# Patient Record
Sex: Male | Born: 1950
Health system: Southern US, Community
[De-identification: ages and names within clinical notes are randomized; demographics above are authoritative.]

## PROBLEM LIST (undated history)

## (undated) DIAGNOSIS — N200 Calculus of kidney: Secondary | ICD-10-CM

---

## 2001-05-11 ENCOUNTER — Encounter: Admission: RE | Admit: 2001-05-11 | Discharge: 2001-05-11 | Payer: Self-pay | Admitting: Family Medicine

## 2001-05-11 ENCOUNTER — Encounter: Payer: Self-pay | Admitting: Family Medicine

## 2006-08-08 ENCOUNTER — Emergency Department (HOSPITAL_COMMUNITY): Admission: EM | Admit: 2006-08-08 | Discharge: 2006-08-08 | Payer: Self-pay | Admitting: Emergency Medicine

## 2016-11-10 DIAGNOSIS — H9113 Presbycusis, bilateral: Secondary | ICD-10-CM | POA: Diagnosis not present

## 2016-11-10 DIAGNOSIS — J3489 Other specified disorders of nose and nasal sinuses: Secondary | ICD-10-CM | POA: Diagnosis not present

## 2016-11-10 DIAGNOSIS — H903 Sensorineural hearing loss, bilateral: Secondary | ICD-10-CM | POA: Diagnosis not present

## 2016-11-10 DIAGNOSIS — H9313 Tinnitus, bilateral: Secondary | ICD-10-CM | POA: Diagnosis not present

## 2016-12-05 DIAGNOSIS — J028 Acute pharyngitis due to other specified organisms: Secondary | ICD-10-CM | POA: Diagnosis not present

## 2017-02-19 DIAGNOSIS — R109 Unspecified abdominal pain: Secondary | ICD-10-CM | POA: Diagnosis not present

## 2017-02-19 DIAGNOSIS — M545 Low back pain: Secondary | ICD-10-CM | POA: Diagnosis not present

## 2017-03-01 DIAGNOSIS — M545 Low back pain: Secondary | ICD-10-CM | POA: Diagnosis not present

## 2017-03-08 DIAGNOSIS — M545 Low back pain: Secondary | ICD-10-CM | POA: Diagnosis not present

## 2017-03-22 DIAGNOSIS — R69 Illness, unspecified: Secondary | ICD-10-CM | POA: Diagnosis not present

## 2017-10-08 DIAGNOSIS — Z23 Encounter for immunization: Secondary | ICD-10-CM | POA: Diagnosis not present

## 2017-10-08 DIAGNOSIS — Z125 Encounter for screening for malignant neoplasm of prostate: Secondary | ICD-10-CM | POA: Diagnosis not present

## 2017-10-08 DIAGNOSIS — K429 Umbilical hernia without obstruction or gangrene: Secondary | ICD-10-CM | POA: Diagnosis not present

## 2017-10-08 DIAGNOSIS — Z1159 Encounter for screening for other viral diseases: Secondary | ICD-10-CM | POA: Diagnosis not present

## 2017-10-08 DIAGNOSIS — Z0000 Encounter for general adult medical examination without abnormal findings: Secondary | ICD-10-CM | POA: Diagnosis not present

## 2017-10-08 DIAGNOSIS — E7800 Pure hypercholesterolemia, unspecified: Secondary | ICD-10-CM | POA: Diagnosis not present

## 2017-10-11 DIAGNOSIS — R69 Illness, unspecified: Secondary | ICD-10-CM | POA: Diagnosis not present

## 2017-11-01 DIAGNOSIS — J069 Acute upper respiratory infection, unspecified: Secondary | ICD-10-CM | POA: Diagnosis not present

## 2018-03-14 DIAGNOSIS — M545 Low back pain: Secondary | ICD-10-CM | POA: Diagnosis not present

## 2018-03-28 DIAGNOSIS — S335XXD Sprain of ligaments of lumbar spine, subsequent encounter: Secondary | ICD-10-CM | POA: Diagnosis not present

## 2018-03-28 DIAGNOSIS — M545 Low back pain: Secondary | ICD-10-CM | POA: Diagnosis not present

## 2018-04-11 DIAGNOSIS — J020 Streptococcal pharyngitis: Secondary | ICD-10-CM | POA: Diagnosis not present

## 2018-04-11 DIAGNOSIS — J029 Acute pharyngitis, unspecified: Secondary | ICD-10-CM | POA: Diagnosis not present

## 2018-06-29 DIAGNOSIS — R69 Illness, unspecified: Secondary | ICD-10-CM | POA: Diagnosis not present

## 2018-07-26 DIAGNOSIS — R69 Illness, unspecified: Secondary | ICD-10-CM | POA: Diagnosis not present

## 2018-09-16 ENCOUNTER — Emergency Department (HOSPITAL_COMMUNITY)
Admission: EM | Admit: 2018-09-16 | Discharge: 2018-09-16 | Disposition: A | Discharge status: Home / Self Care | Payer: Medicare HMO | Attending: Emergency Medicine | Admitting: Emergency Medicine

## 2018-09-16 ENCOUNTER — Encounter (HOSPITAL_COMMUNITY): Payer: Self-pay | Admitting: *Deleted

## 2018-09-16 ENCOUNTER — Other Ambulatory Visit: Payer: Self-pay

## 2018-09-16 ENCOUNTER — Emergency Department (HOSPITAL_COMMUNITY): Payer: Medicare HMO

## 2018-09-16 DIAGNOSIS — N200 Calculus of kidney: Secondary | ICD-10-CM | POA: Insufficient documentation

## 2018-09-16 DIAGNOSIS — Z79899 Other long term (current) drug therapy: Secondary | ICD-10-CM | POA: Insufficient documentation

## 2018-09-16 DIAGNOSIS — R109 Unspecified abdominal pain: Secondary | ICD-10-CM | POA: Diagnosis not present

## 2018-09-16 DIAGNOSIS — N132 Hydronephrosis with renal and ureteral calculous obstruction: Secondary | ICD-10-CM | POA: Diagnosis not present

## 2018-09-16 DIAGNOSIS — R1012 Left upper quadrant pain: Secondary | ICD-10-CM | POA: Diagnosis present

## 2018-09-16 HISTORY — DX: Calculus of kidney: N20.0

## 2018-09-16 LAB — CBC WITH DIFFERENTIAL/PLATELET
Abs Immature Granulocytes: 0.04 10*3/uL (ref 0.00–0.07)
Basophils Absolute: 0 10*3/uL (ref 0.0–0.1)
Basophils Relative: 0 %
Eosinophils Absolute: 0.3 10*3/uL (ref 0.0–0.5)
Eosinophils Relative: 4 %
HCT: 46.2 % (ref 39.0–52.0)
Hemoglobin: 15 g/dL (ref 13.0–17.0)
Immature Granulocytes: 1 %
Lymphocytes Relative: 15 %
Lymphs Abs: 1.1 10*3/uL (ref 0.7–4.0)
MCH: 29 pg (ref 26.0–34.0)
MCHC: 32.5 g/dL (ref 30.0–36.0)
MCV: 89.2 fL (ref 80.0–100.0)
Monocytes Absolute: 0.8 10*3/uL (ref 0.1–1.0)
Monocytes Relative: 10 %
Neutro Abs: 5.1 10*3/uL (ref 1.7–7.7)
Neutrophils Relative %: 70 %
Platelets: 238 10*3/uL (ref 150–400)
RBC: 5.18 MIL/uL (ref 4.22–5.81)
RDW: 13 % (ref 11.5–15.5)
WBC: 7.3 10*3/uL (ref 4.0–10.5)
nRBC: 0 % (ref 0.0–0.2)

## 2018-09-16 LAB — COMPREHENSIVE METABOLIC PANEL
ALT: 23 U/L (ref 0–44)
AST: 24 U/L (ref 15–41)
Albumin: 3.9 g/dL (ref 3.5–5.0)
Alkaline Phosphatase: 60 U/L (ref 38–126)
Anion gap: 14 (ref 5–15)
BUN: 17 mg/dL (ref 8–23)
CO2: 22 mmol/L (ref 22–32)
Calcium: 8.7 mg/dL — ABNORMAL LOW (ref 8.9–10.3)
Chloride: 104 mmol/L (ref 98–111)
Creatinine, Ser: 1.14 mg/dL (ref 0.61–1.24)
GFR calc Af Amer: >60 mL/min (ref >60)
GFR calc non Af Amer: >60 mL/min (ref >60)
Glucose, Bld: 120 mg/dL — ABNORMAL HIGH (ref 70–99)
Potassium: 4 mmol/L (ref 3.5–5.1)
Sodium: 140 mmol/L (ref 135–145)
Total Bilirubin: 0.7 mg/dL (ref 0.3–1.2)
Total Protein: 7.3 g/dL (ref 6.5–8.1)

## 2018-09-16 LAB — URINALYSIS, ROUTINE W REFLEX MICROSCOPIC
Bilirubin Urine: NEGATIVE
Glucose, UA: NEGATIVE mg/dL
Ketones, ur: NEGATIVE mg/dL
Leukocytes, UA: NEGATIVE
Nitrite: NEGATIVE
Protein, ur: NEGATIVE mg/dL
Specific Gravity, Urine: >1.03 — ABNORMAL HIGH (ref 1.005–1.030)
pH: 5.5 (ref 5.0–8.0)

## 2018-09-16 MED ORDER — TAMSULOSIN HCL 0.4 MG PO CAPS: 0.4000 mg | ORAL_CAPSULE | Freq: Every day | ORAL | 0 refills | Status: AC

## 2018-09-16 MED ORDER — ONDANSETRON HCL 4 MG/2ML IJ SOLN
4.0000 mg | Freq: Once | INTRAMUSCULAR | Status: AC
  Administered 2018-09-16: 4 mg via INTRAVENOUS
  Filled 2018-09-16: qty 2

## 2018-09-16 MED ORDER — HYDROCODONE-ACETAMINOPHEN 5-325 MG PO TABS: 1.0000 | ORAL_TABLET | Freq: Four times a day (QID) | ORAL | 0 refills | Status: AC | PRN

## 2018-09-16 MED ORDER — SODIUM CHLORIDE 0.9 % IV BOLUS
250.0000 mL | Freq: Once | INTRAVENOUS | Status: AC
  Administered 2018-09-16: 250 mL via INTRAVENOUS

## 2018-09-16 MED ORDER — ONDANSETRON HCL 4 MG PO TABS: 4.0000 mg | ORAL_TABLET | Freq: Three times a day (TID) | ORAL | 0 refills | Status: AC | PRN

## 2018-09-16 MED ORDER — MORPHINE SULFATE (PF) 4 MG/ML IV SOLN
4.0000 mg | Freq: Once | INTRAVENOUS | Status: AC
  Administered 2018-09-16: 4 mg via INTRAVENOUS
  Filled 2018-09-16: qty 1

## 2018-09-16 MED ORDER — KETOROLAC TROMETHAMINE 15 MG/ML IJ SOLN
15.0000 mg | Freq: Once | INTRAMUSCULAR | Status: AC
  Administered 2018-09-16: 15 mg via INTRAVENOUS
  Filled 2018-09-16: qty 1

## 2018-09-16 MED ORDER — HYDROMORPHONE HCL 1 MG/ML IJ SOLN
0.5000 mg | Freq: Once | INTRAMUSCULAR | Status: AC
  Administered 2018-09-16: 0.5 mg via INTRAVENOUS
  Filled 2018-09-16: qty 1

## 2018-09-16 NOTE — ED Notes (Signed)
Discharge instructions reviewed with patient. Patient verbalizes understanding. VSS.   

## 2018-09-16 NOTE — ED Provider Notes (Signed)
[facility]-EMERGENCY DEPT Provider Note   CSN: [id number] Arrival date & time: 09/16/18  0216     History   Chief Complaint Chief Complaint  Patient presents with  . Flank Pain    HPI Maryln GottronGordon J Thiemann is a 67 y.o. male presenting for evaluation of left-sided flank pain.  Patient states he developed left-sided flank pain earlier tonight.  It is sharp and severe, but waxing and waning, coming in waves.  He denies associated fevers, chills, chest pain, shortness of breath, nausea, vomiting, anterior abdominal pain, urinary symptoms, normal bowel movements.  Patient states he had a kidney stone about 10 years ago, this feels similar.  He states he has no medical problems, takes simvastatin and no other medicines daily.  He ibuprofen prior to arrival, with minimal improvement of his symptoms.  He has not taken anything else.  HPI  Past Medical History:  Diagnosis Date  . Kidney stone     There are no active problems to display for this patient.   History reviewed. No pertinent surgical history.      Home Medications    Prior to Admission medications   Medication Sig Start Date End Date Taking? Authorizing Provider  simvastatin (ZOCOR) 40 MG tablet Take 40 mg by mouth daily. 08/20/16  Yes [provider]  HYDROcodone-acetaminophen (NORCO/VICODIN) 5-325 MG tablet Take 1 tablet by mouth every 6 (six) hours as needed for severe pain. 09/16/18   Rosenda Geffrard, PA-C  ondansetron (ZOFRAN) 4 MG tablet Take 1 tablet (4 mg total) by mouth every 8 (eight) hours as needed. 09/16/18   Alvilda Mckenna, PA-C  tamsulosin (FLOMAX) 0.4 MG CAPS capsule Take 1 capsule (0.4 mg total) by mouth daily. 09/16/18   Kevin Space, PA-C    Family History No family history on file.  Social History Social History   Tobacco Use  . Smoking status: Not on file  Substance Use Topics  . Alcohol use: Never    Frequency: Never  . Drug use: Never      Allergies   Patient has no known allergies.   Review of Systems Review of Systems  Genitourinary: Positive for flank pain.  All other systems reviewed and are negative.    Physical Exam Updated Vital Signs BP 128/69 (BP Location: Right Arm)   Pulse (!) 58   Temp 97.7 F (36.5 C) (Oral)   Resp 18   SpO2 96%   Physical Exam Vitals signs and nursing note reviewed.  Constitutional:      General: He is not in acute distress.    Appearance: He is well-developed.     Comments: Appears uncomfortable due to pain, no acute distress  HENT:     Head: Normocephalic and atraumatic.  Eyes:     Conjunctiva/sclera: Conjunctivae normal.     Pupils: Pupils are equal, round, and reactive to light.  Neck:     Musculoskeletal: Normal range of motion and neck supple.  Cardiovascular:     Rate and Rhythm: Normal rate and regular rhythm.  Pulmonary:     Effort: Pulmonary effort is normal. No respiratory distress.     Breath sounds: Normal breath sounds. No wheezing.  Abdominal:     General: Bowel sounds are normal. There is no distension.     Palpations: Abdomen is soft. There is no mass.     Tenderness: There is no abdominal tenderness. There is left CVA tenderness. There is no right CVA tenderness, guarding or rebound.  Comments: Left-sided CVA tenderness.  No tenderness palpation of the anterior abdomen.  Soft without rigidity, guarding, distention.  Negative rebound.  Musculoskeletal: Normal range of motion.  Skin:    General: Skin is warm and dry.     Capillary Refill: Capillary refill takes less than 2 seconds.  Neurological:     Mental Status: He is alert and oriented to person, place, and time.      ED Treatments / Results  Labs (all labs ordered are listed, but only abnormal results are displayed) Labs Reviewed  COMPREHENSIVE METABOLIC PANEL - Abnormal; Notable for the following components:      Result Value   Glucose, Bld 120 (*)    Calcium 8.7 (*)    All  other components within normal limits  URINALYSIS, ROUTINE W REFLEX MICROSCOPIC - Abnormal; Notable for the following components:   Specific Gravity, Urine >1.030 (*)    Hgb urine dipstick LARGE (*)    Bacteria, UA RARE (*)    All other components within normal limits  URINE CULTURE  CBC WITH DIFFERENTIAL/PLATELET    EKG None  Radiology Ct Renal Stone Study  Result Date: 09/16/2018 CLINICAL DATA:  Intermittent left flank pain. EXAM: CT ABDOMEN AND PELVIS WITHOUT CONTRAST TECHNIQUE: Multidetector CT imaging of the abdomen and pelvis was performed following the standard protocol without IV contrast. COMPARISON:  08/08/2006 FINDINGS: Lower chest: Lung bases are clear. Hepatobiliary: No focal liver abnormality is seen. No gallstones, gallbladder wall thickening, or biliary dilatation. Pancreas: Unremarkable. No pancreatic ductal dilatation or surrounding inflammatory changes. Spleen: Normal in size without focal abnormality. Adrenals/Urinary Tract: No adrenal gland nodules. 4 mm stone in the distal left ureter just above the ureterovesical junction. Mild proximal hydronephrosis and hydroureter. Stranding around the left kidney. Several additional stones within the left kidney, largest in the lower pole measuring 4 mm in diameter. No hydronephrosis or stones on the right. Bladder is normal. Stomach/Bowel: Stomach is within normal limits. Appendix appears normal. No evidence of bowel wall thickening, distention, or inflammatory changes. Vascular/Lymphatic: Aortic atherosclerosis. No enlarged abdominal or pelvic lymph nodes. Reproductive: Prostate is unremarkable. Other: No free air or free fluid in the abdomen. Small periumbilical hernia containing fat. Musculoskeletal: No acute or significant osseous findings. IMPRESSION: 4 mm stone in the distal left ureter with moderate proximal obstruction. Additional nonobstructing stones in the left kidney. Electronically Signed   By: Burman Nieves M.D.   On:  09/16/2018 04:26    Procedures Procedures (including critical care time)  Medications Ordered in ED Medications  morphine 4 MG/ML injection 4 mg (4 mg Intravenous Given 09/16/18 0359)  ondansetron (ZOFRAN) injection 4 mg (4 mg Intravenous Given 09/16/18 0359)  sodium chloride 0.9 % bolus 250 mL (0 mLs Intravenous Stopped 09/16/18 0527)  HYDROmorphone (DILAUDID) injection 0.5 mg (0.5 mg Intravenous Given 09/16/18 0418)  ketorolac (TORADOL) 15 MG/ML injection 15 mg (15 mg Intravenous Given 09/16/18 0418)     Initial Impression / Assessment and Plan / ED Course  I have reviewed the triage vital signs and the nursing notes.  Pertinent labs & imaging results that were available during my care of the patient were reviewed by me and considered in my medical decision making (see chart for details).     Presenting for evaluation of left-sided flank pain.  Physical exam reassuring, he is afebrile not tachycardic.  Appears nontoxic.  Tenderness palpation over the CVA.  As pain is waxing and waning, consider kidney stone.  Patient without urinary symptoms, low suspicion  for UTI or Pyelo. Low suspicion for cardiac or pulmonary cause. Will order labs, urine, and CT. morphine and Zofran for pain.   Informed by RN, pain is not improved with morphine. Will given small dose of dilaudid. Labs reassuring, no leukocytosis, kidney fnx reassuring, will also give toradol.   CT with 4 mm stone on L side. UA pending. On reassessment, pt states pain is much improved.   Urine without infection. Will d/c pt with symptomatic medication and f/u with urology as needed. At this time, pt appears safe for s/c. Return precautions given. Pt states he understands and agrees to plan.    Final Clinical Impressions(s) / ED Diagnoses   Final diagnoses:  Kidney stone on left side    ED Discharge Orders         Ordered    HYDROcodone-acetaminophen (NORCO/VICODIN) 5-325 MG tablet  Every 6 hours PRN     09/16/18 0711     ondansetron (ZOFRAN) 4 MG tablet  Every 8 hours PRN     09/16/18 0711    tamsulosin (FLOMAX) 0.4 MG CAPS capsule  Daily     09/16/18 0711           Alveria ApleyCaccavale, Simcha Speir, PA-C 09/16/18 0715    Geoffery Lyonselo, Douglas, MD 09/17/18 [phone]

## 2018-09-16 NOTE — ED Triage Notes (Signed)
Reports left flank pain that comes and goes. No urinary issues, nausea or vomiting. Started last night. Hx of kidney stone about 10 years ago. Last took ibuprofen around 2230.

## 2018-09-16 NOTE — Discharge Instructions (Addendum)
Make sure you stay well hydrated. Your urine should be clear to pale yellow.  Take flomax daily.  Take ibuprofen 3 times a day with meals as needed for pain. Take 800 mg (4 pills) at a time.  Do not take other anti-inflammatories at the same time (Advil, Motrin, naproxen, Aleve). You may supplement with Tylenol if you need further pain control. Use norco as needed for severe or breakthrough pain. Have caution, this is a narcotic pain medication. Do not drive while taking this medication.  Use zofran as needed for nausea or vomiting.  Follow up with urology as needed if pain is persisting.  Return to the ER if you develop fevers, persistent vomiting, severe/worsening/not improved pain, inability to urinate, or with any new, worsening, or concerning symptoms.

## 2018-09-17 LAB — URINE CULTURE: Culture: NO GROWTH

## 2018-11-30 DIAGNOSIS — E7800 Pure hypercholesterolemia, unspecified: Secondary | ICD-10-CM | POA: Diagnosis not present

## 2018-11-30 DIAGNOSIS — Z125 Encounter for screening for malignant neoplasm of prostate: Secondary | ICD-10-CM | POA: Diagnosis not present

## 2018-11-30 DIAGNOSIS — K429 Umbilical hernia without obstruction or gangrene: Secondary | ICD-10-CM | POA: Diagnosis not present

## 2018-11-30 DIAGNOSIS — Z0000 Encounter for general adult medical examination without abnormal findings: Secondary | ICD-10-CM | POA: Diagnosis not present

## 2018-11-30 DIAGNOSIS — N200 Calculus of kidney: Secondary | ICD-10-CM | POA: Diagnosis not present

## 2019-05-08 ENCOUNTER — Other Ambulatory Visit: Payer: Self-pay

## 2019-05-08 DIAGNOSIS — Z20822 Contact with and (suspected) exposure to covid-19: Secondary | ICD-10-CM

## 2019-05-09 LAB — NOVEL CORONAVIRUS, NAA: SARS-CoV-2, NAA: NOT DETECTED

## 2019-06-06 DIAGNOSIS — R69 Illness, unspecified: Secondary | ICD-10-CM | POA: Diagnosis not present

## 2019-08-18 ENCOUNTER — Other Ambulatory Visit: Payer: Self-pay

## 2019-08-18 DIAGNOSIS — Z20822 Contact with and (suspected) exposure to covid-19: Secondary | ICD-10-CM

## 2019-08-21 LAB — NOVEL CORONAVIRUS, NAA: SARS-CoV-2, NAA: NOT DETECTED

## 2019-11-06 ENCOUNTER — Ambulatory Visit: Payer: Medicare HMO | Attending: Internal Medicine

## 2019-11-06 DIAGNOSIS — Z23 Encounter for immunization: Secondary | ICD-10-CM | POA: Insufficient documentation

## 2019-11-06 NOTE — Progress Notes (Signed)
   Covid-19 Vaccination Clinic  Name:  Jon Morris    MRN: [id number] DOB: [birth year]  11/06/2019  Jon Morris was observed post Covid-19 immunization for 15 minutes without incidence. He was provided with Vaccine Information Sheet and instruction to access the V-Safe system.   Jon Morris was instructed to call 911 with any severe reactions post vaccine: Marland Kitchen Difficulty breathing  . Swelling of your face and throat  . A fast heartbeat  . A bad rash all over your body  . Dizziness and weakness    Immunizations Administered    Name Date Dose VIS Date Route   Pfizer COVID-19 Vaccine 11/06/2019  6:12 PM 0.3 mL 09/08/2019 Intramuscular   Manufacturer: [employer], [employer]   Lot: [id number]   NDC: [address]-9

## 2019-12-01 ENCOUNTER — Ambulatory Visit: Payer: Medicare HMO | Attending: Internal Medicine

## 2019-12-01 DIAGNOSIS — Z23 Encounter for immunization: Secondary | ICD-10-CM | POA: Insufficient documentation

## 2019-12-01 NOTE — Progress Notes (Signed)
   Covid-19 Vaccination Clinic  Name:  Jon Morris    MRN: [id number] DOB: 09-Oct-1950  12/01/2019  [name] was observed post Covid-19 immunization for 15 minutes without incident. He was provided with Vaccine Information Sheet and instruction to access the V-Safe system.   [name] was instructed to call 911 with any severe reactions post vaccine: Marland Kitchen Difficulty breathing  . Swelling of face and throat  . A fast heartbeat  . A bad rash all over body  . Dizziness and weakness   Immunizations Administered    Name Date Dose VIS Date Route   Pfizer COVID-19 Vaccine 12/01/2019  6:21 PM 0.3 mL 09/08/2019 Intramuscular   Manufacturer: [employer], [employer]   Lot: [id number]   NDC: [address]-6

## 2019-12-19 DIAGNOSIS — E7800 Pure hypercholesterolemia, unspecified: Secondary | ICD-10-CM | POA: Diagnosis not present

## 2019-12-19 DIAGNOSIS — Z0000 Encounter for general adult medical examination without abnormal findings: Secondary | ICD-10-CM | POA: Diagnosis not present

## 2019-12-19 DIAGNOSIS — K429 Umbilical hernia without obstruction or gangrene: Secondary | ICD-10-CM | POA: Diagnosis not present

## 2019-12-19 DIAGNOSIS — Z125 Encounter for screening for malignant neoplasm of prostate: Secondary | ICD-10-CM | POA: Diagnosis not present

## 2020-01-17 IMAGING — CT CT RENAL STONE PROTOCOL
2 of 4 series · 16 of 46 positions shown, 18 images · non-contrast
Comparison: 08/08/2006

CLINICAL DATA: Intermittent left flank pain.

EXAM:
CT ABDOMEN AND PELVIS WITHOUT CONTRAST
TECHNIQUE: Multidetector CT imaging of the abdomen and pelvis was performed
following the standard protocol without IV contrast.

[Series 3: axial st · axial · 0.75mm/px · z∈[+1174,+1600]mm · 13 of 95 slices shown, 15 images]
[im 5/95  soft-tissue]
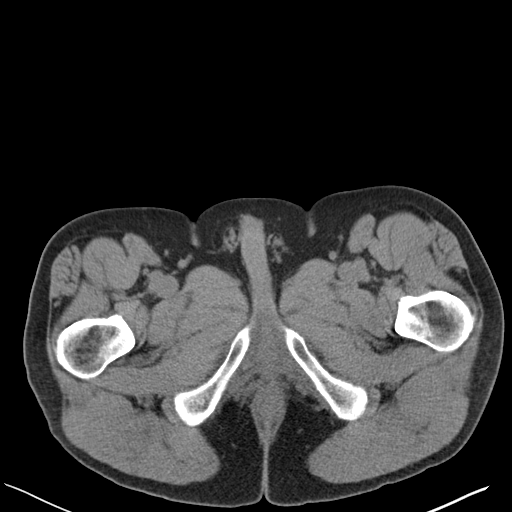
[im 5/95  bone]
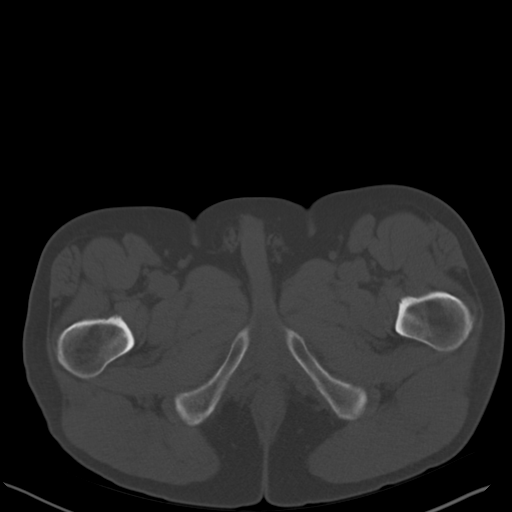
[im 14/95  soft-tissue]
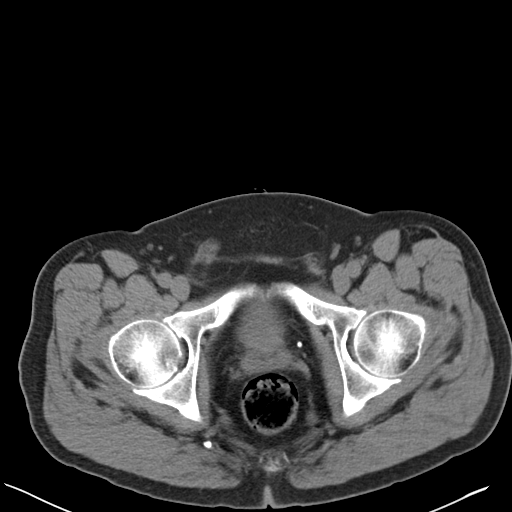
[im 18/95  soft-tissue]
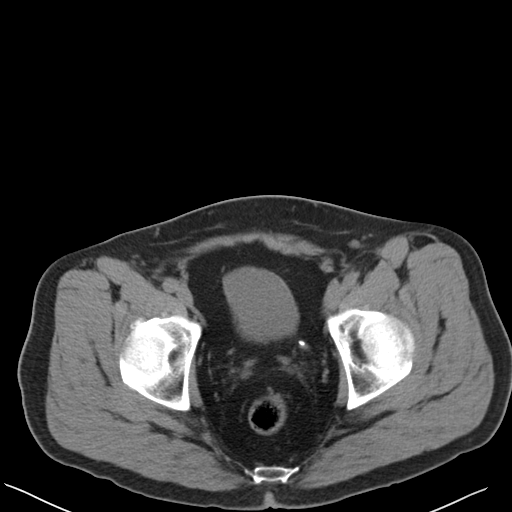
[im 27/95  soft-tissue]
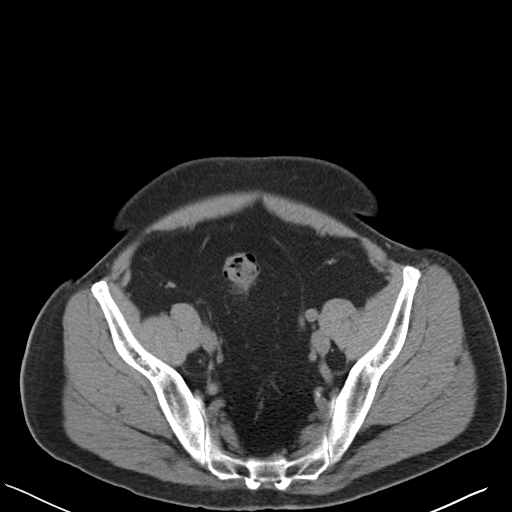
[im 32/95  soft-tissue]
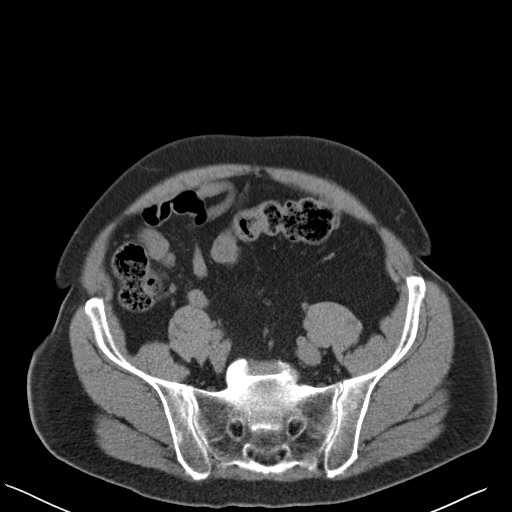
[im 41/95  soft-tissue]
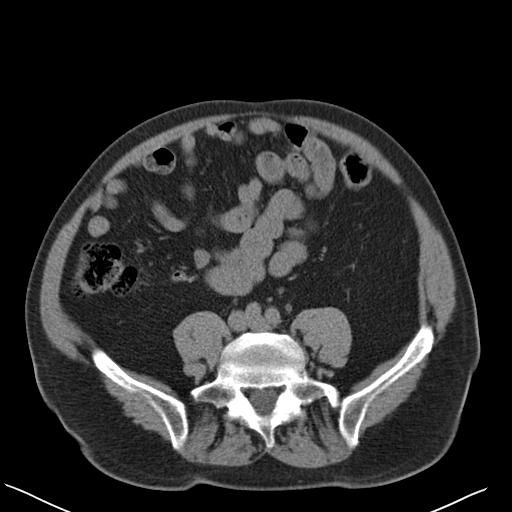
[im 50/95  soft-tissue]
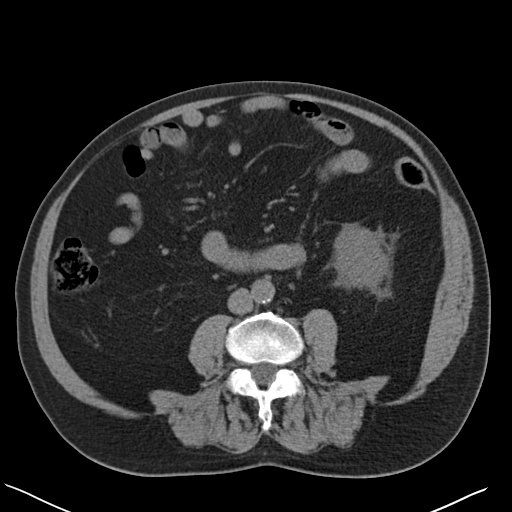
[im 54/95  soft-tissue]
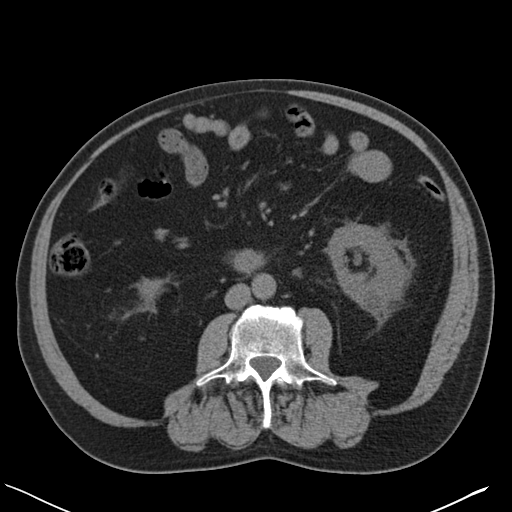
[im 63/95  soft-tissue]
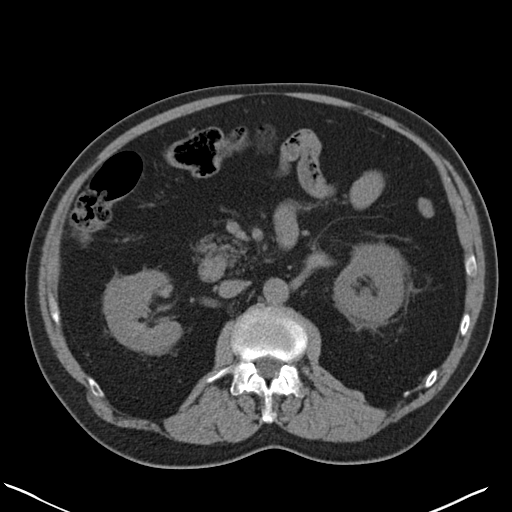
[im 63/95  bone]
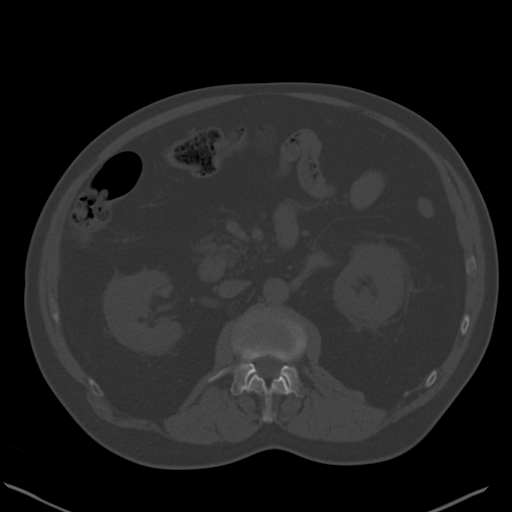
[im 68/95  soft-tissue]
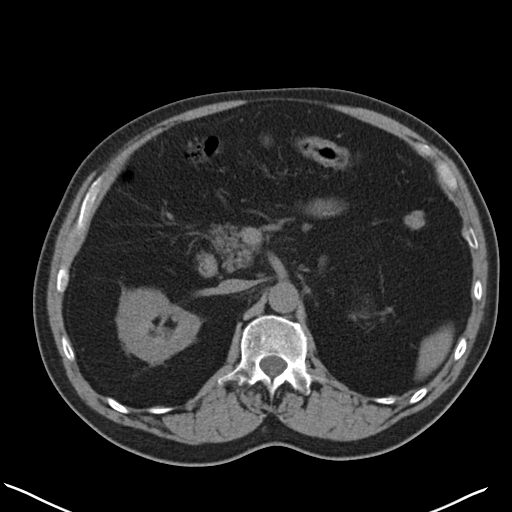
[im 77/95  soft-tissue]
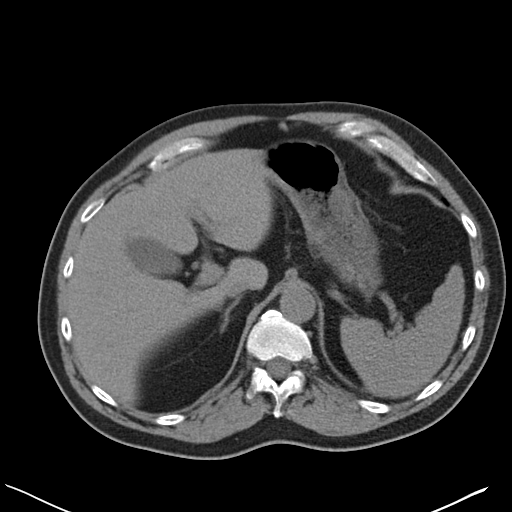
[im 81/95  soft-tissue]
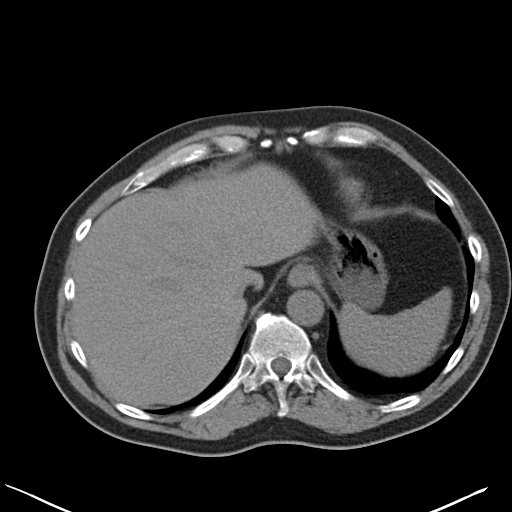
[im 90/95  soft-tissue]
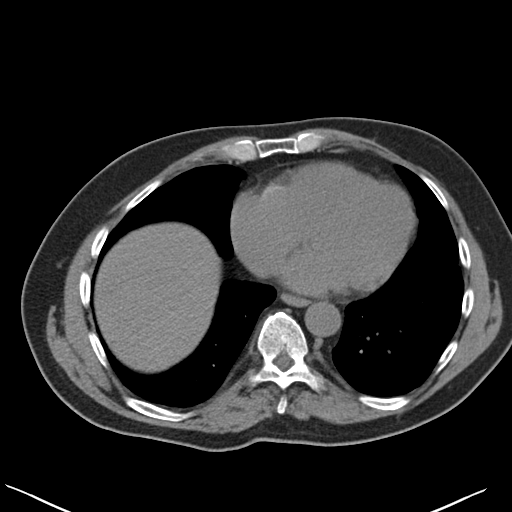

[Series 6: coronal · coronal · 0.71mm/px · 3 of 159 slices shown]
[im 53/159  soft-tissue]
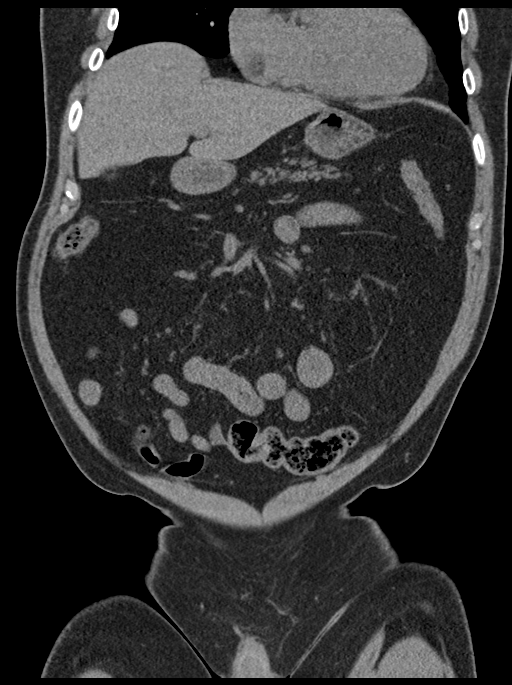
[im 71/159  soft-tissue]
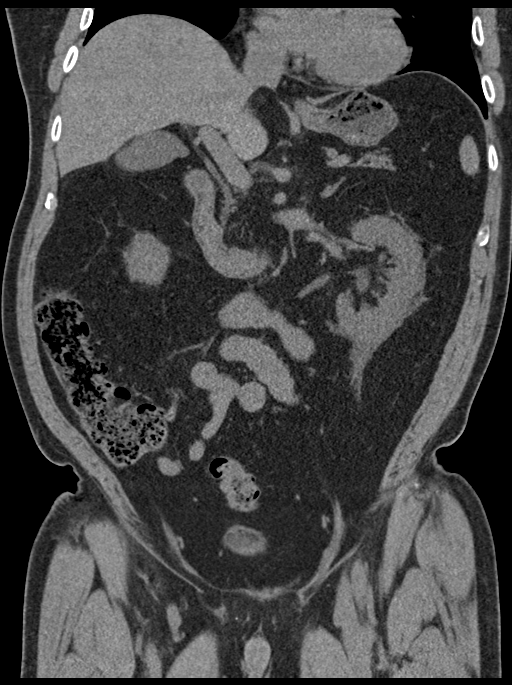
[im 88/159  soft-tissue]
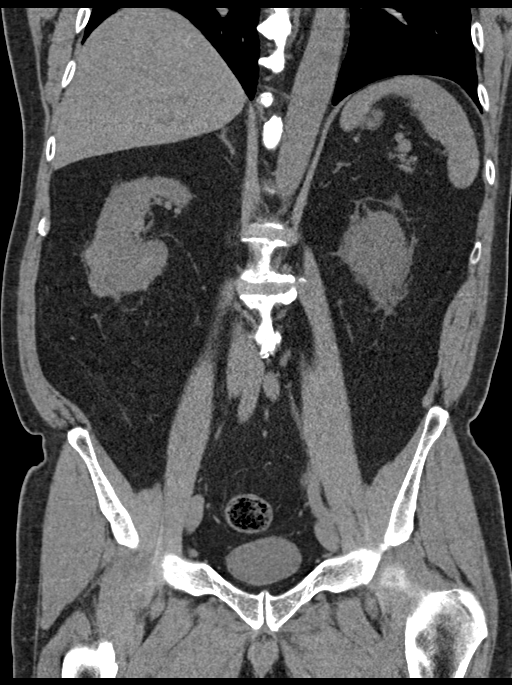

[16 of 46 positions shown; findings below may reference images not displayed]

FINDINGS: Lower chest: Lung bases are clear.

Hepatobiliary: No focal liver abnormality is seen. No gallstones,
gallbladder wall thickening, or biliary dilatation.

Pancreas: Unremarkable. No pancreatic ductal dilatation or
surrounding inflammatory changes.

Spleen: Normal in size without focal abnormality.

Adrenals/Urinary Tract: No adrenal gland nodules. 4 mm stone in the
distal left ureter just above the ureterovesical junction. Mild
proximal hydronephrosis and hydroureter. Stranding around the left
kidney. Several additional stones within the left kidney, largest in
the lower pole measuring 4 mm in diameter. No hydronephrosis or
stones on the right. Bladder is normal.

Stomach/Bowel: Stomach is within normal limits. Appendix appears
normal. No evidence of bowel wall thickening, distention, or
inflammatory changes.

Vascular/Lymphatic: Aortic atherosclerosis. No enlarged abdominal or
pelvic lymph nodes.

Reproductive: Prostate is unremarkable.

Other: No free air or free fluid in the abdomen. Small periumbilical
hernia containing fat.

Musculoskeletal: No acute or significant osseous findings.
IMPRESSION: 4 mm stone in the distal left ureter with moderate proximal
obstruction. Additional nonobstructing stones in the left kidney.

## 2020-05-09 DIAGNOSIS — Z20822 Contact with and (suspected) exposure to covid-19: Secondary | ICD-10-CM | POA: Diagnosis not present

## 2020-05-20 DIAGNOSIS — Z20822 Contact with and (suspected) exposure to covid-19: Secondary | ICD-10-CM | POA: Diagnosis not present

## 2020-05-29 DIAGNOSIS — Z20822 Contact with and (suspected) exposure to covid-19: Secondary | ICD-10-CM | POA: Diagnosis not present

## 2020-06-18 DIAGNOSIS — R69 Illness, unspecified: Secondary | ICD-10-CM | POA: Diagnosis not present

## 2020-12-20 DIAGNOSIS — Z1211 Encounter for screening for malignant neoplasm of colon: Secondary | ICD-10-CM | POA: Diagnosis not present

## 2020-12-20 DIAGNOSIS — K429 Umbilical hernia without obstruction or gangrene: Secondary | ICD-10-CM | POA: Diagnosis not present

## 2020-12-20 DIAGNOSIS — E7800 Pure hypercholesterolemia, unspecified: Secondary | ICD-10-CM | POA: Diagnosis not present

## 2020-12-20 DIAGNOSIS — Z0000 Encounter for general adult medical examination without abnormal findings: Secondary | ICD-10-CM | POA: Diagnosis not present

## 2020-12-23 DIAGNOSIS — Z1211 Encounter for screening for malignant neoplasm of colon: Secondary | ICD-10-CM | POA: Diagnosis not present

## 2021-07-10 DIAGNOSIS — E785 Hyperlipidemia, unspecified: Secondary | ICD-10-CM | POA: Diagnosis not present

## 2021-07-10 DIAGNOSIS — Z87891 Personal history of nicotine dependence: Secondary | ICD-10-CM | POA: Diagnosis not present

## 2021-07-10 DIAGNOSIS — Z825 Family history of asthma and other chronic lower respiratory diseases: Secondary | ICD-10-CM | POA: Diagnosis not present

## 2021-07-10 DIAGNOSIS — R030 Elevated blood-pressure reading, without diagnosis of hypertension: Secondary | ICD-10-CM | POA: Diagnosis not present

## 2021-07-10 DIAGNOSIS — R69 Illness, unspecified: Secondary | ICD-10-CM | POA: Diagnosis not present

## 2021-07-10 DIAGNOSIS — M1990 Unspecified osteoarthritis, unspecified site: Secondary | ICD-10-CM | POA: Diagnosis not present

## 2021-07-10 DIAGNOSIS — Z800 Family history of malignant neoplasm of digestive organs: Secondary | ICD-10-CM | POA: Diagnosis not present

## 2021-07-10 DIAGNOSIS — K469 Unspecified abdominal hernia without obstruction or gangrene: Secondary | ICD-10-CM | POA: Diagnosis not present

## 2022-01-09 DIAGNOSIS — E7800 Pure hypercholesterolemia, unspecified: Secondary | ICD-10-CM | POA: Diagnosis not present

## 2022-01-09 DIAGNOSIS — K429 Umbilical hernia without obstruction or gangrene: Secondary | ICD-10-CM | POA: Diagnosis not present

## 2022-01-09 DIAGNOSIS — R413 Other amnesia: Secondary | ICD-10-CM | POA: Diagnosis not present

## 2022-01-09 DIAGNOSIS — Z1211 Encounter for screening for malignant neoplasm of colon: Secondary | ICD-10-CM | POA: Diagnosis not present

## 2022-01-09 DIAGNOSIS — Z0000 Encounter for general adult medical examination without abnormal findings: Secondary | ICD-10-CM | POA: Diagnosis not present

## 2022-01-09 DIAGNOSIS — Z125 Encounter for screening for malignant neoplasm of prostate: Secondary | ICD-10-CM | POA: Diagnosis not present

## 2022-02-20 DIAGNOSIS — Z1211 Encounter for screening for malignant neoplasm of colon: Secondary | ICD-10-CM | POA: Diagnosis not present

## 2023-01-28 DIAGNOSIS — K429 Umbilical hernia without obstruction or gangrene: Secondary | ICD-10-CM | POA: Diagnosis not present

## 2023-01-28 DIAGNOSIS — Z125 Encounter for screening for malignant neoplasm of prostate: Secondary | ICD-10-CM | POA: Diagnosis not present

## 2023-01-28 DIAGNOSIS — Z1211 Encounter for screening for malignant neoplasm of colon: Secondary | ICD-10-CM | POA: Diagnosis not present

## 2023-01-28 DIAGNOSIS — Z0000 Encounter for general adult medical examination without abnormal findings: Secondary | ICD-10-CM | POA: Diagnosis not present

## 2023-01-28 DIAGNOSIS — E7800 Pure hypercholesterolemia, unspecified: Secondary | ICD-10-CM | POA: Diagnosis not present

## 2023-01-28 DIAGNOSIS — Z23 Encounter for immunization: Secondary | ICD-10-CM | POA: Diagnosis not present

## 2023-03-02 DIAGNOSIS — Z1211 Encounter for screening for malignant neoplasm of colon: Secondary | ICD-10-CM | POA: Diagnosis not present

## 2023-03-02 DIAGNOSIS — E7800 Pure hypercholesterolemia, unspecified: Secondary | ICD-10-CM | POA: Diagnosis not present

## 2023-03-05 DIAGNOSIS — M7711 Lateral epicondylitis, right elbow: Secondary | ICD-10-CM | POA: Diagnosis not present

## 2024-01-31 DIAGNOSIS — E7800 Pure hypercholesterolemia, unspecified: Secondary | ICD-10-CM | POA: Diagnosis not present

## 2024-01-31 DIAGNOSIS — Z1211 Encounter for screening for malignant neoplasm of colon: Secondary | ICD-10-CM | POA: Diagnosis not present

## 2024-01-31 DIAGNOSIS — R7303 Prediabetes: Secondary | ICD-10-CM | POA: Diagnosis not present

## 2024-01-31 DIAGNOSIS — Z125 Encounter for screening for malignant neoplasm of prostate: Secondary | ICD-10-CM | POA: Diagnosis not present

## 2024-01-31 DIAGNOSIS — R7309 Other abnormal glucose: Secondary | ICD-10-CM | POA: Diagnosis not present

## 2024-01-31 DIAGNOSIS — Z130 Encounter for screening for diseases of the blood and blood-forming organs and certain disorders involving the immune mechanism: Secondary | ICD-10-CM | POA: Diagnosis not present

## 2024-01-31 DIAGNOSIS — Z0000 Encounter for general adult medical examination without abnormal findings: Secondary | ICD-10-CM | POA: Diagnosis not present

## 2024-02-18 DIAGNOSIS — M545 Low back pain, unspecified: Secondary | ICD-10-CM | POA: Diagnosis not present

## 2024-03-29 ENCOUNTER — Ambulatory Visit (INDEPENDENT_AMBULATORY_CARE_PROVIDER_SITE_OTHER): Admitting: Podiatry

## 2024-03-29 ENCOUNTER — Encounter: Payer: Self-pay | Admitting: Podiatry

## 2024-03-29 DIAGNOSIS — L600 Ingrowing nail: Secondary | ICD-10-CM

## 2024-03-29 DIAGNOSIS — M79671 Pain in right foot: Secondary | ICD-10-CM

## 2024-03-29 NOTE — Patient Instructions (Signed)

## 2024-03-29 NOTE — Progress Notes (Signed)
 Patient complains of painful ingrown both border(s) toe hallux right. Patient denies fevers, chills, nausea, vomiting.  Objective:  Vitals: Reviewed  General: Well developed, nourished, in no acute distress, alert and oriented x3   Vascular: DP pulse 2/4 bilateral. PT pulse 2 to/4 bilateral.  Mild edema hallux right.  Capillary refill time immediate bilaterally  Dermatology: Erythema, edema, incurvated nail border both hallux right with clear drainage . Tenderness present with palpation. Normal skin tone and texture feet with normal hair growth.  Neurological: Grossly intact. Normal reflexes.   Musculoskeletal: Tenderness with palpation of the distal hallux right. No tenderness or painful ROM at IPJ.  DIagnosis: 1.  Pain right foot 2.  Ingrown nail both borders hallux right  Plan: -New patient visit office level 3.  Modifier 25 -discussed etiology and treatment of ingrown nails. Discussed surgical vs conservative treatment. -Consent signed for appropriate matrixectomy affected nail(s).  Procedure(s):   - Matrixectomy(s) both borders hallux right: Toe anesthetized with 3cc 2:1 mixture 2% Lidocaine with epinephrine: Sodium Bicarbonate. Surgical site prepped. Digital tourniquet applied.  Avulsion of borders. performed. Matrixecomy performed with three 30 second applications of phenol to nail matrix. Site irrigated with alcohol.  Tourniquet released with good vascularity noticed in digit.  Applied triple antibiotic to nailbed and applied gauze and Coban dressing. - Written and oral postoperative instructions given.  -Return for post-op 2 weeks.  JINNY Prentice Binder, DPM

## 2024-04-11 ENCOUNTER — Ambulatory Visit: Admitting: Podiatry

## 2024-04-11 ENCOUNTER — Encounter: Payer: Self-pay | Admitting: Podiatry

## 2024-04-11 DIAGNOSIS — L600 Ingrowing nail: Secondary | ICD-10-CM | POA: Diagnosis not present

## 2024-04-11 DIAGNOSIS — S91302A Unspecified open wound, left foot, initial encounter: Secondary | ICD-10-CM

## 2024-04-11 MED ORDER — NEOMYCIN-POLYMYXIN-HC 3.5-10000-1 OT SOLN: OTIC | 1 refills | Status: AC

## 2024-04-11 MED ORDER — CEPHALEXIN 500 MG PO CAPS: 1000.0000 mg | ORAL_CAPSULE | Freq: Two times a day (BID) | ORAL | 0 refills | Status: AC

## 2024-04-11 NOTE — Progress Notes (Addendum)
 Patient presents follow-up nail surgery left great toe.  Some pain around the toe with some blistering and draining along the proximal nail fold.  No F/C or N/v.SABRA  Physical exam:  Vascular: Pedal pulses palpable bilaterally  Dermatologic: Nail surgery site for matrixectomy healing well with redness and blistering along the proximal nail fold.  Musculoskeletal: Tenderness dorsal hallux right  Diagnosis: 1.  Slow healing wound hallux right.  Initial.  Plan: - Established office visit for evaluation and management.  Level 3. - Discussed with him that he probably had a hyper inflammatory reaction to the phenol.  Could be getting some cellulitis to will have him continue soaking it twice daily warm salt water, apply Cortisporin otic solution drops, and a light dressing.  Should be healed in another 2 or 3 weeks if not he can call or if he has any concerns. -Rx Keflex  500 mg, 2 p.o. twice daily for 7 days - Rx Cortisporin otic solution drops, 2 drops to wound hallux left twice daily after soaks  -Return as needed

## 2024-10-30 NOTE — Progress Notes (Signed)
 Jon Morris                                          MRN: [id number]   10/30/2024   The [facility] Quality Team Specialist reviewed this patient medical record for the purposes of chart review for care gap closure. The following were reviewed: chart review for care gap closure-colorectal cancer screening.    [facility] Quality Team
